# Patient Record
Sex: Male | Born: 2000 | Race: Black or African American | Hispanic: No | Marital: Single
Health system: Southern US, Community
[De-identification: ages and names within clinical notes are randomized; demographics above are authoritative.]

---
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
## 2019-11-27 ENCOUNTER — Encounter (HOSPITAL_COMMUNITY): Payer: Self-pay

## 2019-11-27 ENCOUNTER — Emergency Department (HOSPITAL_COMMUNITY): Payer: Medicaid Other

## 2019-11-27 ENCOUNTER — Emergency Department (HOSPITAL_COMMUNITY)
Admission: EM | Admit: 2019-11-27 | Discharge: 2019-11-27 | Disposition: A | Discharge status: Home / Self Care | Payer: Medicaid Other | Attending: Emergency Medicine | Admitting: Emergency Medicine

## 2019-11-27 ENCOUNTER — Other Ambulatory Visit: Payer: Self-pay

## 2019-11-27 DIAGNOSIS — Y929 Unspecified place or not applicable: Secondary | ICD-10-CM | POA: Diagnosis not present

## 2019-11-27 DIAGNOSIS — Y939 Activity, unspecified: Secondary | ICD-10-CM | POA: Insufficient documentation

## 2019-11-27 DIAGNOSIS — S7012XA Contusion of left thigh, initial encounter: Secondary | ICD-10-CM | POA: Insufficient documentation

## 2019-11-27 DIAGNOSIS — W230XXA Caught, crushed, jammed, or pinched between moving objects, initial encounter: Secondary | ICD-10-CM | POA: Insufficient documentation

## 2019-11-27 DIAGNOSIS — T148XXA Other injury of unspecified body region, initial encounter: Secondary | ICD-10-CM

## 2019-11-27 DIAGNOSIS — Y999 Unspecified external cause status: Secondary | ICD-10-CM | POA: Diagnosis not present

## 2019-11-27 DIAGNOSIS — S70922A Unspecified superficial injury of left thigh, initial encounter: Secondary | ICD-10-CM | POA: Diagnosis present

## 2019-11-27 LAB — CBC WITH DIFFERENTIAL/PLATELET
Abs Immature Granulocytes: 0.03 10*3/uL (ref 0.00–0.07)
Basophils Absolute: 0.1 10*3/uL (ref 0.0–0.1)
Basophils Relative: 1 %
Eosinophils Absolute: 0.1 10*3/uL (ref 0.0–0.5)
Eosinophils Relative: 1 %
HCT: 45.4 % (ref 39.0–52.0)
Hemoglobin: 14.5 g/dL (ref 13.0–17.0)
Immature Granulocytes: 0 %
Lymphocytes Relative: 16 %
Lymphs Abs: 1.7 10*3/uL (ref 0.7–4.0)
MCH: 29.2 pg (ref 26.0–34.0)
MCHC: 31.9 g/dL (ref 30.0–36.0)
MCV: 91.5 fL (ref 80.0–100.0)
Monocytes Absolute: 0.7 10*3/uL (ref 0.1–1.0)
Monocytes Relative: 7 %
Neutro Abs: 7.8 10*3/uL — ABNORMAL HIGH (ref 1.7–7.7)
Neutrophils Relative %: 75 %
Platelets: 208 10*3/uL (ref 150–400)
RBC: 4.96 MIL/uL (ref 4.22–5.81)
RDW: 12.5 % (ref 11.5–15.5)
WBC: 10.4 10*3/uL (ref 4.0–10.5)
nRBC: 0 % (ref 0.0–0.2)

## 2019-11-27 LAB — BASIC METABOLIC PANEL
Anion gap: 9 (ref 5–15)
BUN: 16 mg/dL (ref 6–20)
CO2: 26 mmol/L (ref 22–32)
Calcium: 9.2 mg/dL (ref 8.9–10.3)
Chloride: 107 mmol/L (ref 98–111)
Creatinine, Ser: 1.11 mg/dL (ref 0.61–1.24)
GFR calc Af Amer: >60 mL/min (ref >60)
GFR calc non Af Amer: >60 mL/min (ref >60)
Glucose, Bld: 94 mg/dL (ref 70–99)
Potassium: 4 mmol/L (ref 3.5–5.1)
Sodium: 142 mmol/L (ref 135–145)

## 2019-11-27 LAB — URINALYSIS, COMPLETE (UACMP) WITH MICROSCOPIC
Bacteria, UA: NONE SEEN
Bilirubin Urine: NEGATIVE
Glucose, UA: NEGATIVE mg/dL
Hgb urine dipstick: NEGATIVE
Ketones, ur: NEGATIVE mg/dL
Leukocytes,Ua: NEGATIVE
Nitrite: NEGATIVE
Protein, ur: NEGATIVE mg/dL
Specific Gravity, Urine: 1.02 (ref 1.005–1.030)
pH: 6 (ref 5.0–8.0)

## 2019-11-27 LAB — CK: Total CK: 858 U/L — ABNORMAL HIGH (ref 49–397)

## 2019-11-27 MED ORDER — LACTATED RINGERS IV BOLUS
1000.0000 mL | Freq: Once | INTRAVENOUS | Status: AC
  Administered 2019-11-27: 1000 mL via INTRAVENOUS

## 2019-11-27 MED ORDER — ACETAMINOPHEN 500 MG PO TABS
1000.0000 mg | ORAL_TABLET | Freq: Once | ORAL | Status: AC
  Administered 2019-11-27: 1000 mg via ORAL
  Filled 2019-11-27: qty 2

## 2019-11-27 NOTE — ED Notes (Signed)
Pt transported to xray 
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
## 2020-07-25 ENCOUNTER — Other Ambulatory Visit: Payer: Self-pay

## 2020-07-25 ENCOUNTER — Ambulatory Visit: Payer: No Typology Code available for payment source | Attending: Internal Medicine

## 2020-07-25 DIAGNOSIS — Z23 Encounter for immunization: Secondary | ICD-10-CM

## 2020-07-25 NOTE — Progress Notes (Signed)
   [id number]  Name:  Dalton Ross.    MRN: [id number] DOB: 2001/03/31  07/25/2020  Mr. [name] was observed post Covid-19 immunization for 15 minutes without incident. He was provided with Vaccine Information Sheet and instruction to access the V-Safe system.   Mr. [name] was instructed to call 911 with any severe reactions post vaccine: Marland Kitchen Difficulty breathing  . Swelling of face and throat  . A fast heartbeat  . A bad rash all over body  . Dizziness and weakness   Immunizations Administered    Name Date Dose VIS Date Route   PFIZER Comrnaty(Gray TOP) Covid-19 Vaccine 07/25/2020  1:21 PM 0.3 mL 05/25/2020 Intramuscular   Manufacturer: [employer]   Lot: [id number]   NDC: [phone]

## 2020-08-21 ENCOUNTER — Ambulatory Visit: Payer: Medicaid Other

## 2020-11-14 ENCOUNTER — Other Ambulatory Visit: Payer: Self-pay

## 2020-11-14 ENCOUNTER — Encounter (HOSPITAL_COMMUNITY): Payer: Self-pay | Admitting: *Deleted

## 2020-11-14 ENCOUNTER — Emergency Department (HOSPITAL_COMMUNITY)
Admission: EM | Admit: 2020-11-14 | Discharge: 2020-11-15 | Disposition: A | Discharge status: Home / Self Care | Payer: Medicaid Other | Attending: Emergency Medicine | Admitting: Emergency Medicine

## 2020-11-14 ENCOUNTER — Emergency Department (HOSPITAL_COMMUNITY): Payer: Medicaid Other

## 2020-11-14 DIAGNOSIS — S61215A Laceration without foreign body of left ring finger without damage to nail, initial encounter: Secondary | ICD-10-CM | POA: Diagnosis not present

## 2020-11-14 DIAGNOSIS — Y990 Civilian activity done for income or pay: Secondary | ICD-10-CM | POA: Insufficient documentation

## 2020-11-14 DIAGNOSIS — Y9389 Activity, other specified: Secondary | ICD-10-CM | POA: Insufficient documentation

## 2020-11-14 DIAGNOSIS — S61219A Laceration without foreign body of unspecified finger without damage to nail, initial encounter: Secondary | ICD-10-CM

## 2020-11-14 DIAGNOSIS — S6992XA Unspecified injury of left wrist, hand and finger(s), initial encounter: Secondary | ICD-10-CM | POA: Diagnosis present

## 2020-11-14 DIAGNOSIS — Y9289 Other specified places as the place of occurrence of the external cause: Secondary | ICD-10-CM | POA: Insufficient documentation

## 2020-11-14 DIAGNOSIS — Z23 Encounter for immunization: Secondary | ICD-10-CM | POA: Diagnosis not present

## 2020-11-14 DIAGNOSIS — W293XXA Contact with powered garden and outdoor hand tools and machinery, initial encounter: Secondary | ICD-10-CM | POA: Insufficient documentation

## 2020-11-14 MED ORDER — TETANUS-DIPHTH-ACELL PERTUSSIS 5-2.5-18.5 LF-MCG/0.5 IM SUSY
0.5000 mL | PREFILLED_SYRINGE | Freq: Once | INTRAMUSCULAR | Status: AC
  Administered 2020-11-14: 0.5 mL via INTRAMUSCULAR
  Filled 2020-11-14: qty 0.5

## 2020-11-14 MED ORDER — CEPHALEXIN 500 MG PO CAPS
500.0000 mg | ORAL_CAPSULE | Freq: Once | ORAL | Status: AC
  Administered 2020-11-14: 500 mg via ORAL
  Filled 2020-11-14: qty 1

## 2020-11-14 MED ORDER — LIDOCAINE HCL (PF) 1 % IJ SOLN
10.0000 mL | Freq: Once | INTRAMUSCULAR | Status: AC
  Administered 2020-11-14: 10 mL via INTRADERMAL
  Filled 2020-11-14: qty 30

## 2020-11-14 NOTE — ED Triage Notes (Signed)
Pt has lacerations to left middle and ring fingers from a hedge trimmer. He is unsure of last tetanus shot.

## 2020-11-14 NOTE — ED Provider Notes (Signed)
[name] Provider Note   CSN: [id number] Arrival date & time: 11/14/20  1628     History Chief Complaint  Patient presents with  . Laceration    Dalton Ross. is a 20 y.o. male who is right handed presenting with left finger lacerations.  Accidentally cut his 3rd and 4th left finger tips with a bush trimmer at work today.  Unsure of last tetanus.  Bleeding has stopped.  NKDA.  Does not take medications.  HPI     History reviewed. No pertinent past medical history.  There are no problems to display for this patient.   History reviewed. No pertinent surgical history.     No family history on file.  Social History   Substance Use Topics  . Alcohol use: No    Home Medications Prior to Admission medications   Medication Sig Start Date End Date Taking? Authorizing Provider  cephALEXin (KEFLEX) 500 MG capsule Take 1 capsule (500 mg total) by mouth 2 (two) times daily for 7 days. 11/15/20 11/22/20 Yes Elyzabeth Goatley, Carola Rhine, MD  triamcinolone cream (KENALOG) 0.1 % Apply 1 application topically 2 (two) times daily. 12/09/18   Orvan July, NP    Allergies    Patient has no known allergies.  Review of Systems   Review of Systems  Constitutional: Negative for chills and fever.  Musculoskeletal: Positive for arthralgias and myalgias.  Skin: Positive for wound. Negative for rash.  Neurological: Negative for weakness and numbness.    Physical Exam Updated Vital Signs BP 126/86 (BP Location: Right Arm)   Pulse 61   Temp (!) 97.5 F (36.4 C) (Oral)   Resp 18   Ht 5\' 7"  (1.702 m)   Wt 66.7 kg   SpO2 100%   BMI 23.02 kg/m   Physical Exam Constitutional:      General: He is not in acute distress. HENT:     Head: Normocephalic and atraumatic.  Cardiovascular:     Rate and Rhythm: Normal rate and regular rhythm.     Pulses: Normal pulses.  Abdominal:     Tenderness: There is no abdominal tenderness.  Skin:    General: Skin  is warm and dry.     Comments: Partial thickness laceration of the left 3rd and 4th pad of left finger tips, no active bleeding Sensation and ROM intact in hand  Neurological:     General: No focal deficit present.     Mental Status: He is alert. Mental status is at baseline.     ED Results / Procedures / Treatments   Labs (all labs ordered are listed, but only abnormal results are displayed) Labs Reviewed - No data to display  EKG None  Radiology DG Hand Complete Left  Result Date: 11/14/2020 CLINICAL DATA:  Ring finger and middle finger laceration EXAM: LEFT HAND - COMPLETE 3+ VIEW COMPARISON:  None. FINDINGS: Soft tissue lacerations noted involving the tips of the ring and middle fingers of the left hand. No radiopaque foreign bodies. No acute bony abnormality. Specifically, no fracture, subluxation, or dislocation. IMPRESSION: No acute bony abnormality. Electronically Signed   By: Rolm Baptise M.D.   On: 11/14/2020 17:43    Procedures .Marland KitchenLaceration Repair  Date/Time: 11/15/2020 10:22 AM Performed by: Wyvonnia Dusky, MD Authorized by: Wyvonnia Dusky, MD   Consent:    Consent obtained:  Verbal   Consent given by:  Patient   Risks, benefits, and alternatives were discussed: yes  Risks discussed:  Infection, pain and poor cosmetic result   Alternatives discussed:  Observation Universal protocol:    Procedure explained and questions answered to patient or proxy's satisfaction: yes     Relevant documents present and verified: yes     Test results available: yes     Imaging studies available: yes     Required blood products, implants, devices, and special equipment available: yes     Site/side marked: yes     Immediately prior to procedure, a time out was called: yes     Patient identity confirmed:  Arm band Anesthesia:    Anesthesia method:  Nerve block   Block location:  3rd and 4th digit lef hand   Block needle gauge:  25 G   Block anesthetic:  Lidocaine 1% w/o  epi   Block injection procedure:  Anatomic landmarks identified, introduced needle, negative aspiration for blood, anatomic landmarks palpated and incremental injection   Block outcome:  Anesthesia achieved Laceration details:    Location:  Finger   Finger location:  L ring finger (and left 3rd finger)   Length (cm):  1   Depth (mm):  3 Pre-procedure details:    Preparation:  Patient was prepped and draped in usual sterile fashion Exploration:    Hemostasis achieved with:  Direct pressure   Imaging obtained: x-ray     Imaging outcome: foreign body not noted     Wound exploration: wound explored through full range of motion     Contaminated: no   Treatment:    Area cleansed with:  Saline   Amount of cleaning:  Standard   Irrigation solution:  Sterile saline   Irrigation method:  Pressure wash   Debridement:  None   Undermining:  None Skin repair:    Repair method:  Sutures   Suture size:  4-0   Suture material:  Prolene   Suture technique:  Simple interrupted   Number of sutures:  8 (4 sutures in the 3rd digit, adn 4 sutures in 4th digit)) Approximation:    Approximation:  Close Repair type:    Repair type:  Intermediate Post-procedure details:    Dressing:  Non-adherent dressing   Procedure completion:  Tolerated well, no immediate complications     Medications Ordered in ED Medications  lidocaine (PF) (XYLOCAINE) 1 % injection 10 mL (10 mLs Intradermal Given 11/14/20 2333)  Tdap (BOOSTRIX) injection 0.5 mL (0.5 mLs Intramuscular Given 11/14/20 2330)  cephALEXin (KEFLEX) capsule 500 mg (500 mg Oral Given 11/14/20 2332)    ED Course  I have reviewed the triage vital signs and the nursing notes.  Pertinent labs & imaging results that were available during my care of the patient were reviewed by me and considered in my medical decision making (see chart for details).  Laceration injury to left hand at work today. Tdap ordered Xrays reviewed - no acute underlying  fx Wound was irrigated and cleaned, then stitched close after performing a digital finger block around the 3rd and 4th digit  Given the dirty nature of the wound, I initiated the patient on keflex as infection prophylaxis.  I discussed signs and symptoms of infection and flexor tenosynovitis, and advised that he arrange for follow up in approx 10 days for suture removal.  Given the location of these wounds, his livelihood, and the high risk for infection, I advised f/u in the orthopedic hand clinic, and provided that phone number.     Final Clinical Impression(s) / ED Diagnoses Final  diagnoses:  Laceration of finger of left hand without foreign body without damage to nail, unspecified finger, initial encounter    Rx / DC Orders ED Discharge Orders         Ordered    cephALEXin (KEFLEX) 500 MG capsule  2 times daily        11/15/20 0019           Wyvonnia Dusky, MD 11/15/20 1024

## 2020-11-14 NOTE — ED Provider Notes (Signed)
Emergency Medicine Provider Triage Evaluation Note  Dalton Ross. , a 20 y.o. male  was evaluated in triage.  Pt complains of lacerations to left third and fourth digits from a hedge trimmer just prior to arrival.  He denies any numbness.  Unsure of last tetanus shot.  Review of Systems  Positive: Laceration Negative: Numbness  Physical Exam  BP 123/73 (BP Location: Right Arm)   Pulse (!) 103   Temp 98.3 F (36.8 C) (Oral)   Resp 16   Ht 5\' 7"  (1.702 m)   Wt 66.7 kg   SpO2 100%   BMI 23.02 kg/m  Gen:   Awake, no distress   Resp:  Normal effort  MSK:   Moves extremities without difficulty  Other:  Lacerations noted to the DIP joint of the third and fourth digits of the left hand.  Normal range of motion and sensation  Medical Decision Making  Medically screening exam initiated at 5:10 PM.  Appropriate orders placed.  Dalton Ross. was informed that the remainder of the evaluation will be completed by another provider, this initial triage assessment does not replace that evaluation, and the importance of remaining in the ED until their evaluation is complete.  X-ray ordered   Delia Heady, PA-C 11/14/20 1711    Wyvonnia Dusky, MD 11/15/20 [phone]

## 2020-11-15 MED ORDER — CEPHALEXIN 500 MG PO CAPS: 500.0000 mg | ORAL_CAPSULE | Freq: Two times a day (BID) | ORAL | 0 refills | Status: AC

## 2020-11-15 NOTE — Discharge Instructions (Signed)
Keep your hand dry for 24 hours, then you can wash with regular soap and water.  Your stitches need to be removed in 7-10 days.  Do not wait longer.  If you notice redness streaking up your fingers and palm, or you begin having fevers, come back to the ER.  These may be signs of a serious infection of the hand.  You can take tylenol and motrin for pain.
